# Patient Record
Sex: Male | Born: 1990 | Race: Black or African American | Hispanic: No | Marital: Single | Smoking: Current every day smoker
Health system: Southern US, Community
[De-identification: ages and names within clinical notes are randomized; demographics above are authoritative.]

---

## 2016-11-13 ENCOUNTER — Ambulatory Visit: Payer: Self-pay

## 2018-01-26 ENCOUNTER — Ambulatory Visit (INDEPENDENT_AMBULATORY_CARE_PROVIDER_SITE_OTHER): Payer: Self-pay

## 2018-01-26 ENCOUNTER — Encounter: Payer: Self-pay | Admitting: Family Medicine

## 2018-01-26 ENCOUNTER — Ambulatory Visit: Payer: Self-pay | Admitting: Family Medicine

## 2018-01-26 VITALS — BP 110/70 | HR 78 | Ht 67.0 in | Wt 129.1 lb

## 2018-01-26 DIAGNOSIS — J189 Pneumonia, unspecified organism: Secondary | ICD-10-CM

## 2018-01-26 DIAGNOSIS — J181 Lobar pneumonia, unspecified organism: Secondary | ICD-10-CM

## 2018-01-26 LAB — CBC
HCT: 44.5 % (ref 39.0–52.0)
Hemoglobin: 14.9 g/dL (ref 13.0–17.0)
MCHC: 33.5 g/dL (ref 30.0–36.0)
MCV: 95.6 fl (ref 78.0–100.0)
Platelets: 420 10*3/uL — ABNORMAL HIGH (ref 150.0–400.0)
RBC: 4.65 Mil/uL (ref 4.22–5.81)
RDW: 12.6 % (ref 11.5–15.5)
WBC: 8.6 10*3/uL (ref 4.0–10.5)

## 2018-01-26 MED ORDER — HYDROCODONE-HOMATROPINE 5-1.5 MG/5ML PO SYRP: 5.0000 mL | ORAL_SOLUTION | Freq: Four times a day (QID) | ORAL | 0 refills | Status: AC | PRN

## 2018-01-26 MED ORDER — AZITHROMYCIN 250 MG PO TABS: ORAL_TABLET | ORAL | 0 refills | Status: AC

## 2018-01-26 NOTE — Patient Instructions (Addendum)

## 2018-01-26 NOTE — Progress Notes (Signed)
Subjective:  Patient ID: George Carter, male    DOB: March 05, 1991  Age: 27 y.o. MRN: [id number]  CC: Establish Care and Cough   HPI [name] presents for evaluation of 5 days history of URI symptoms.  He has been left with a cough that is been productive of purulent phlegm.  He feels a soreness in his right upper chest area.  He feels tight in the chest with a question of wheezing.  He has no history of asthma and does not smoke.  He has no facial pressure or teeth pain.  Has ongoing issues with postnasal drip he says.  History George Carter has no past medical history on file.   He has no past surgical history on file.   His family history is not on file.He reports that he has quit smoking. he has never used smokeless tobacco. His alcohol and drug histories are not on file.  No outpatient medications prior to visit.   No facility-administered medications prior to visit.     ROS Review of Systems  Constitutional: Positive for fatigue. Negative for chills and fever.  HENT: Positive for postnasal drip. Negative for congestion, facial swelling, rhinorrhea, sinus pressure, sinus pain, sore throat and trouble swallowing.   Eyes: Negative for photophobia.  Respiratory: Positive for cough and chest tightness. Negative for shortness of breath and wheezing.   Cardiovascular: Negative.   Gastrointestinal: Negative.   Musculoskeletal: Negative for arthralgias and myalgias.  Skin: Negative for pallor and rash.  Neurological: Negative for weakness and headaches.  Hematological: Does not bruise/bleed easily.  Psychiatric/Behavioral: Negative.     Objective:  BP 110/70 (BP Location: Left Arm, Patient Position: Sitting, Cuff Size: Normal)   Pulse 78   Ht 5\' 7"  (1.702 m)   Wt 129 lb 2 oz (58.6 kg)   SpO2 96%   BMI 20.22 kg/m   Physical Exam  Constitutional: He is oriented to person, place, and time. He appears well-developed and well-nourished. No distress.  HENT:  Head: Normocephalic  and atraumatic.  Right Ear: External ear normal.  Left Ear: External ear normal.  Mouth/Throat: Oropharynx is clear and moist. No oropharyngeal exudate.  Eyes: Conjunctivae are normal. Pupils are equal, round, and reactive to light. Right eye exhibits no discharge. Left eye exhibits no discharge. No scleral icterus.  Neck: Neck supple. No JVD present. No tracheal deviation present. No thyromegaly present.  Cardiovascular: Normal rate, regular rhythm and normal heart sounds.  Pulmonary/Chest: Effort normal. No stridor. He has no decreased breath sounds. He has no wheezes. He has rhonchi in the right lower field and the left lower field. He has rales in the right lower field and the left lower field.  Abdominal: Bowel sounds are normal.  Lymphadenopathy:    He has no cervical adenopathy.  Neurological: He is oriented to person, place, and time.  Skin: Skin is warm and dry. He is not diaphoretic.  Psychiatric: He has a normal mood and affect. His behavior is normal.      Assessment & Plan:   George Carter was seen today for establish care and cough.  Diagnoses and all orders for this visit:  Pneumonia of both lower lobes due to infectious organism (HCC) -     CBC -     DG Chest 2 View; Future -     DG Chest 2 View -     azithromycin (ZITHROMAX) 250 MG tablet; Take 2 pills today and then one each day until finished. -  HYDROcodone-homatropine (HYCODAN) 5-1.5 MG/5ML syrup; Take 5 mLs by mouth every 6 (six) hours as needed for cough.   I am having George Carter start on azithromycin and HYDROcodone-homatropine.  Meds ordered this encounter  Medications  . azithromycin (ZITHROMAX) 250 MG tablet    Sig: Take 2 pills today and then one each day until finished.    Dispense:  6 tablet    Refill:  0  . HYDROcodone-homatropine (HYCODAN) 5-1.5 MG/5ML syrup    Sig: Take 5 mLs by mouth every 6 (six) hours as needed for cough.    Dispense:  120 mL    Refill:  0    Patient will rest for a  few days.  Take medicines as directed.  No obvious pneumonia on the chest x-ray.  CBC results are pending.  Advised him to follow-up within a month for a physical exam. Follow-up: Return in about 3 days (around 01/29/2018), or if symptoms worsen or fail to improve.  George SaxWilliam Alfred Kremer, MD

## 2018-01-29 ENCOUNTER — Ambulatory Visit: Payer: Self-pay | Admitting: Family Medicine

## 2018-01-29 ENCOUNTER — Encounter: Payer: Self-pay | Admitting: Family Medicine

## 2018-01-29 VITALS — BP 100/60 | HR 60 | Temp 98.7°F | Wt 126.0 lb

## 2018-01-29 DIAGNOSIS — J189 Pneumonia, unspecified organism: Secondary | ICD-10-CM

## 2018-01-29 DIAGNOSIS — R0781 Pleurodynia: Secondary | ICD-10-CM | POA: Insufficient documentation

## 2018-01-29 DIAGNOSIS — J181 Lobar pneumonia, unspecified organism: Secondary | ICD-10-CM

## 2018-01-29 LAB — CBC
HEMATOCRIT: 43.3 % (ref 39.0–52.0)
Hemoglobin: 14.8 g/dL (ref 13.0–17.0)
MCHC: 34.2 g/dL (ref 30.0–36.0)
MCV: 95.2 fl (ref 78.0–100.0)
Platelets: 448 10*3/uL — ABNORMAL HIGH (ref 150.0–400.0)
RBC: 4.55 Mil/uL (ref 4.22–5.81)
RDW: 12.8 % (ref 11.5–15.5)
WBC: 4.6 10*3/uL (ref 4.0–10.5)

## 2018-01-29 MED ORDER — IBUPROFEN 600 MG PO TABS: 600.0000 mg | ORAL_TABLET | Freq: Three times a day (TID) | ORAL | 0 refills | Status: AC | PRN

## 2018-01-29 NOTE — Patient Instructions (Addendum)
Chest Wall Pain Chest wall pain is pain in or around the bones and muscles of your chest. Sometimes, an injury causes this pain. Sometimes, the cause may not be known. This pain may take several weeks or longer to get better. Follow these instructions at home: Pay attention to any changes in your symptoms. Take these actions to help with your pain:  Rest as told by your health care provider.  Avoid activities that cause pain. These include any activities that use your chest muscles or your abdominal and side muscles to lift heavy items.  If directed, apply ice to the painful area: ? Put ice in a plastic bag. ? Place a towel between your skin and the bag. ? Leave the ice on for 20 minutes, 2-3 times per day.  Take over-the-counter and prescription medicines only as told by your health care provider.  Do not use tobacco products, including cigarettes, chewing tobacco, and e-cigarettes. If you need help quitting, ask your health care provider.  Keep all follow-up visits as told by your health care provider. This is important.  Contact a health care provider if:  You have a fever.  Your chest pain becomes worse.  You have new symptoms. Get help right away if:  You have nausea or vomiting.  You feel sweaty or light-headed.  You have a cough with phlegm (sputum) or you cough up blood.  You develop shortness of breath. This information is not intended to replace advice given to you by your health care provider. Make sure you discuss any questions you have with your health care provider. Document Released: 11/10/2005 Document Revised: 03/20/2016 Document Reviewed: 02/05/2015 Elsevier Interactive Patient Education  2018 [employer]  Pleurodynia Pleurodynia is sudden, severe pain in the chest and abdomen that is caused by complications from a viral infection. Pleurodynia may also be called epidemic pleurodynia or [place] disease. In most cases, pleurodynia goes away on its own in  2-5 days. What are the causes? This condition is caused by a virus called coxsackievirus B. In rare cases, other viruses may cause pleurodynia. These include coxsackievirus A or echoviruses. Coxsackievirus B can be spread from person to person (it is contagious). It can be spread through:  Coughing.  Sneezing.  Close physical contact with an infected person.  Contact with the stool of an infected person.  What increases the risk? This condition is more likely to develop in:  Children.  Places where there are poor public health conditions (sanitation).  Places where there is overcrowding.  What are the signs or symptoms? The main symptom of this condition is severe chest pain that makes breathing painful. Pain is usually on one side of the body, along the lower ribs. Pain starts suddenly and may last from a few seconds to 1 minute. You might feel pain again a few minutes or hours later. These brief periods of pain usually come and go for 2-5 days. In some cases, pain may continue to come and go for as long as a month. Other symptoms of pleurodynia may include:  Abdominal pain.  Fever.  Headache.  Sore throat.  Feeling unusually tired (fatigue).  Pain when pressing on the chest or belly.  Dry cough.  Nausea.  Vomiting.  Diarrhea.  Rash.  Testicle pain (men).  How is this diagnosed? This condition is diagnosed based on your medical history and a physical exam. You may have tests, including:  A throat swab.  Stool tests. You may need to give a stool  sample to your health care provider.  Blood tests.  Chest X-rays.  How is this treated? There is no specific treatment for this condition. However, symptoms go away within days or weeks. In the meantime, your health care provider may recommend NSAIDs to help relieve pain and fever. Follow these instructions at home: Medicines  Take over-the-counter and prescription medicines only as told by your health care  provider.  If your child has pleurodynia, do not give your child aspirin because of the association with Reye syndrome. General instructions  Rest for as long as told by your health care provider.  Return to your normal activities as told by your health care provider. Ask your health care provider what activities are safe for you.  Do not use any tobacco products, such as cigarettes, chewing tobacco, and e-cigarettes. If you need help quitting, ask your health care provider.  Drink enough fluid to keep your urine clear or pale yellow.  Keep all follow-up visits as told by your health care provider. This is important. How is this prevented?  Wash your hands often to prevent the virus from spreading. If soap and water are not available, use hand sanitizer.  Make sure that other people in your household also wash their hands often. Contact a health care provider if:  Your symptoms last longer than 5 days.  You have pain that does not get better with medicine. Get help right away if:  You have severe chest pain that is getting worse.  You have difficulty breathing.  You have a sudden, severe headache and a stiff neck. This information is not intended to replace advice given to you by your health care provider. Make sure you discuss any questions you have with your health care provider. Document Released: 10/30/2011 Document Revised: 04/17/2016 Document Reviewed: 05/23/2015 Elsevier Interactive Patient Education  [employer]2018 [employer]

## 2018-01-29 NOTE — Progress Notes (Addendum)
Subjective:  Patient ID: George Carter, male    DOB: [birth year]  Age: 27 y.o. MRN: [id number]  CC: Acute Visit   HPI Randale Carvalho Takagi presents for follow-up of his pneumonia.  His cough is improving and there is been less phlegm and no fever.  He continues to experience right upper chest pain with a deep breath.  There is no shortness of breath, wheezing or hemoptysis.  No family history of DVT.  He is healthy as far as he knows.  He is taking his his medicines as directed.  Outpatient Medications Prior to Visit  Medication Sig Dispense Refill  . azithromycin (ZITHROMAX) 250 MG tablet Take 2 pills today and then one each day until finished. 6 tablet 0  . HYDROcodone-homatropine (HYCODAN) 5-1.5 MG/5ML syrup Take 5 mLs by mouth every 6 (six) hours as needed for cough. 120 mL 0   No facility-administered medications prior to visit.     ROS Review of Systems  Constitutional: Negative for chills, fatigue and fever.  HENT: Negative.   Eyes: Negative for photophobia and visual disturbance.  Respiratory: Positive for cough. Negative for chest tightness and shortness of breath.   Cardiovascular: Positive for chest pain. Negative for palpitations and leg swelling.  Gastrointestinal: Negative.   Musculoskeletal: Negative for arthralgias and myalgias.  Skin: Negative for color change and rash.  Hematological: Does not bruise/bleed easily.  Psychiatric/Behavioral: Negative.     Objective:  BP 100/60 (BP Location: Left Arm, Patient Position: Sitting, Cuff Size: Normal)   Pulse 60   Temp 98.7 F (37.1 C) (Oral)   Wt 126 lb (57.2 kg)   SpO2 95%   BMI 19.73 kg/m   BP Readings from Last 3 Encounters:  01/29/18 100/60  01/26/18 110/70    Wt Readings from Last 3 Encounters:  01/29/18 126 lb (57.2 kg)  01/26/18 129 lb 2 oz (58.6 kg)    Physical Exam  Constitutional: He is oriented to person, place, and time. He appears well-developed and well-nourished. No distress.  HENT:  Head:  Normocephalic and atraumatic.  Right Ear: External ear normal.  Left Ear: External ear normal.  Eyes: Right eye exhibits no discharge. Left eye exhibits no discharge. No scleral icterus.  Neck: Neck supple. No JVD present. No tracheal deviation present. No thyromegaly present.  Cardiovascular: Normal rate, regular rhythm and normal heart sounds.  Pulmonary/Chest: Effort normal. No stridor. No respiratory distress. He has no wheezes. He has rales.  Abdominal: Bowel sounds are normal.  Musculoskeletal:       Right lower leg: He exhibits no tenderness, no swelling and no edema.       Left lower leg: He exhibits no tenderness, no swelling and no edema.  Lymphadenopathy:    He has no cervical adenopathy.  Neurological: He is alert and oriented to person, place, and time.  Skin: Skin is warm and dry. He is not diaphoretic.  Psychiatric: He has a normal mood and affect. His behavior is normal.    Lab Results  Component Value Date   WBC 8.6 01/26/2018   HGB 14.9 01/26/2018   HCT 44.5 01/26/2018   PLT 420.0 (H) 01/26/2018    Patient was never admitted.  Assessment & Plan:   Elvin was seen today for acute visit.  Diagnoses and all orders for this visit:  Pneumonia of both lower lobes due to infectious organism (HCC) -     CBC -     HIV antibody  Pleurodynia -  ibuprofen (ADVIL,MOTRIN) 600 MG tablet; Take 1 tablet (600 mg total) by mouth every 8 (eight) hours as needed.   I am having Kendric J. Manville start on ibuprofen. I am also having him maintain his azithromycin and HYDROcodone-homatropine.  Meds ordered this encounter  Medications  . ibuprofen (ADVIL,MOTRIN) 600 MG tablet    Sig: Take 1 tablet (600 mg total) by mouth every 8 (eight) hours as needed.    Dispense:  30 tablet    Refill:  0     Follow-up: No Follow-up on file.  Mliss SaxWilliam Alfred Lewi Drost, MD

## 2018-01-30 ENCOUNTER — Encounter (HOSPITAL_BASED_OUTPATIENT_CLINIC_OR_DEPARTMENT_OTHER): Payer: Self-pay | Admitting: Emergency Medicine

## 2018-01-30 ENCOUNTER — Emergency Department (HOSPITAL_BASED_OUTPATIENT_CLINIC_OR_DEPARTMENT_OTHER): Payer: Self-pay

## 2018-01-30 ENCOUNTER — Other Ambulatory Visit: Payer: Self-pay

## 2018-01-30 ENCOUNTER — Emergency Department (HOSPITAL_BASED_OUTPATIENT_CLINIC_OR_DEPARTMENT_OTHER)
Admission: EM | Admit: 2018-01-30 | Discharge: 2018-01-30 | Disposition: A | Discharge status: Home / Self Care | Payer: Self-pay | Attending: Emergency Medicine | Admitting: Emergency Medicine

## 2018-01-30 DIAGNOSIS — R05 Cough: Secondary | ICD-10-CM

## 2018-01-30 DIAGNOSIS — R059 Cough, unspecified: Secondary | ICD-10-CM

## 2018-01-30 DIAGNOSIS — F17200 Nicotine dependence, unspecified, uncomplicated: Secondary | ICD-10-CM | POA: Insufficient documentation

## 2018-01-30 DIAGNOSIS — Z79899 Other long term (current) drug therapy: Secondary | ICD-10-CM | POA: Insufficient documentation

## 2018-01-30 DIAGNOSIS — R079 Chest pain, unspecified: Secondary | ICD-10-CM | POA: Insufficient documentation

## 2018-01-30 DIAGNOSIS — J189 Pneumonia, unspecified organism: Secondary | ICD-10-CM | POA: Insufficient documentation

## 2018-01-30 LAB — CBC WITH DIFFERENTIAL/PLATELET
BASOS PCT: 1 %
Basophils Absolute: 0.1 10*3/uL (ref 0.0–0.1)
EOS PCT: 5 %
Eosinophils Absolute: 0.3 10*3/uL (ref 0.0–0.7)
HCT: 43.5 % (ref 39.0–52.0)
Hemoglobin: 14.5 g/dL (ref 13.0–17.0)
LYMPHS ABS: 2 10*3/uL (ref 0.7–4.0)
Lymphocytes Relative: 32 %
MCH: 31.5 pg (ref 26.0–34.0)
MCHC: 33.3 g/dL (ref 30.0–36.0)
MCV: 94.4 fL (ref 78.0–100.0)
Monocytes Absolute: 0.6 10*3/uL (ref 0.1–1.0)
Monocytes Relative: 9 %
Neutro Abs: 3.4 10*3/uL (ref 1.7–7.7)
Neutrophils Relative %: 53 %
PLATELETS: 389 10*3/uL (ref 150–400)
RBC: 4.61 MIL/uL (ref 4.22–5.81)
RDW: 12 % (ref 11.5–15.5)
WBC: 6.4 10*3/uL (ref 4.0–10.5)

## 2018-01-30 LAB — TROPONIN I: Troponin I: <0.03 ng/mL (ref <0.03)

## 2018-01-30 LAB — BASIC METABOLIC PANEL
Anion gap: 8 (ref 5–15)
BUN: 15 mg/dL (ref 6–20)
CALCIUM: 9.3 mg/dL (ref 8.9–10.3)
CHLORIDE: 104 mmol/L (ref 101–111)
CO2: 26 mmol/L (ref 22–32)
CREATININE: 0.86 mg/dL (ref 0.61–1.24)
GFR calc Af Amer: >60 mL/min (ref >60)
GFR calc non Af Amer: >60 mL/min (ref >60)
Glucose, Bld: 85 mg/dL (ref 65–99)
Potassium: 4.3 mmol/L (ref 3.5–5.1)
SODIUM: 138 mmol/L (ref 135–145)

## 2018-01-30 LAB — HIV ANTIBODY (ROUTINE TESTING W REFLEX): HIV: NONREACTIVE

## 2018-01-30 LAB — D-DIMER, QUANTITATIVE: D-Dimer, Quant: 0.64 ug/mL-FEU — ABNORMAL HIGH (ref 0.00–0.50)

## 2018-01-30 MED ORDER — IOPAMIDOL (ISOVUE-370) INJECTION 76%
100.0000 mL | Freq: Once | INTRAVENOUS | Status: AC | PRN
  Administered 2018-01-30: 100 mL via INTRAVENOUS

## 2018-01-30 NOTE — ED Notes (Addendum)
Pt taking antibiotics since Tuesday for pneumonia. States he is concerned bc he's not feeling any better. Reports pain in right chest with cough

## 2018-01-30 NOTE — Discharge Instructions (Signed)
You can take Tylenol or Ibuprofen as directed for pain. You can alternate Tylenol and Ibuprofen every 4 hours. If you take Tylenol at 1pm, then you can take Ibuprofen at 5pm. Then you can take Tylenol again at 9pm.   As we discussed, your discomfort today may be coming from inflammation from coughing any recent diagnosis of pneumonia.  Continue and finish her antibiotics as directed.  Her x-ray showed that her pneumonia is improving but she may have persistent cough for several weeks after.  Follow-up with your primary care doctor in the next 24-48 hours for further evaluation.  Return to the Emergency Department immediately if you experiencing worsening chest pain, difficulty breathing, nausea/vomiting, get very sweaty, headache or any other worsening or concerning symptoms.

## 2018-01-30 NOTE — ED Notes (Signed)
ED Provider at bedside. 

## 2018-01-30 NOTE — ED Triage Notes (Signed)
Pt dx with pneumonia this week and started on abx. Pt reports he is still having pain in his chest and having difficultly taking a deep breath.

## 2018-01-30 NOTE — ED Notes (Signed)
Patient transported to CT 

## 2018-01-30 NOTE — ED Notes (Signed)
Patient transported to X-ray 

## 2018-01-30 NOTE — ED Provider Notes (Signed)
[facility] EMERGENCY DEPARTMENT Provider Note   CSN: [id number] Arrival date & time: 01/30/18  1335     History   Chief Complaint Chief Complaint  Patient presents with  . Follow-up    pneumonia    HPI George Carter is a 27 y.o. male who presents for evaluation of cough and chest pain that has been ongoing for the last 4 days.  Patient was recently diagnosed with pneumonia on 01/26/18 by his primary care doctor.  Was started on azithromycin at that time which he states that he has been taking appropriately. He reports that on 01/27/18 he began having some chest pain that was worse with deep inspiration. He returned to primary care office yesterday for evaluation of persistent cough and chest discomfort.  Was prescribed ibuprofen at that time which he states he has been intermittently taking.  Mom brought patient into the emergency department today because she was concerned about persistent cough and chest pain.  Patient reports that he has chest pain with deep inspiration and with coughing.  Chest pain is right sided and he describes it as a discomfort. No chest pain at rest, with exertion.  No difficulty breathing.  He has not had any nausea or diaphoresis.  Patient states that the cough is nonproductive.  He has been eating and drinking appropriately.  Patient states he smokes a few black in miles a day.  He denies any cocaine, heroin, marijuana use.  He denies any personal cardiac history.  He denies any family cardiac history.  Patient denies any fevers, difficulty breathing, abdominal pain, nausea/vomiting, leg swelling. He denies any hormone/steroid use, recent immobilization, prior history of DVT/PE, recent surgery, leg swelling, or long travel.  The history is provided by the patient.    History reviewed. No pertinent past medical history.  Patient Active Problem List   Diagnosis Date Noted  . 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 Pneumonia of both lower lobes due to infectious  organism (HCC) 01/26/2018    History reviewed. No pertinent surgical history.     Home Medications    Prior to Admission medications   Medication Sig Start Date End Date Taking? Authorizing Provider  azithromycin (ZITHROMAX) 250 MG tablet Take 2 pills today and then one each day until finished. 01/26/18   Mliss Sax, MD  HYDROcodone-homatropine [facility]) 5-1.5 MG/5ML syrup Take 5 mLs by mouth every 6 (six) hours as needed for cough. 01/26/18   Mliss Sax, MD  ibuprofen (ADVIL,MOTRIN) 600 MG tablet Take 1 tablet (600 mg total) by mouth every 8 (eight) hours as needed. 01/29/18   Mliss Sax, MD    Family History No family history on file.  Social History Social History   Tobacco Use  . Smoking status: Current Every Day Smoker  . Smokeless tobacco: Never Used  Substance Use Topics  . Alcohol use: Yes  . Drug use: Not on file     Allergies   Patient has no known allergies.   Review of Systems Review of Systems  Constitutional: Negative for fever.  Respiratory: Positive for cough. Negative for shortness of breath.   Cardiovascular: Positive for chest pain.  Gastrointestinal: Negative for abdominal pain, nausea and vomiting.  Genitourinary: Negative for dysuria and hematuria.  Neurological: Negative for headaches.     Physical Exam Updated Vital Signs BP 108/70 (BP Location: Left Arm)   Pulse 67   Temp 99 F (37.2 C) (Oral)   Resp 18   Ht 5\' 7"  (1.702 m)  Wt 57.2 kg (126 lb)   SpO2 100%   BMI 19.73 kg/m   Physical Exam  Constitutional: He is oriented to person, place, and time. He appears well-developed and well-nourished.  HENT:  Head: Normocephalic and atraumatic.  Mouth/Throat: Oropharynx is clear and moist and mucous membranes are normal.  Eyes: Conjunctivae, EOM and lids are normal. Pupils are equal, round, and reactive to light.  Neck: Full passive range of motion without pain.  Cardiovascular: Normal rate, regular  rhythm, normal heart sounds and normal pulses. Exam reveals no gallop and no friction rub.  No murmur heard. Pulses:      Radial pulses are 2+ on the right side, and 2+ on the left side.  Pulmonary/Chest: Effort normal and breath sounds normal.  No evidence of respiratory distress. Able to speak in full sentences without difficulty.  Tenderness palpation to anterior chest wall.  No deformity or crepitus noted.  Abdominal: Soft. Normal appearance. There is no tenderness. There is no rigidity and no guarding.  Musculoskeletal: Normal range of motion.  BLE are symmetric in appearance. No edema noted.   Neurological: He is alert and oriented to person, place, and time.  Skin: Skin is warm and dry. Capillary refill takes less than 2 seconds.  Psychiatric: He has a normal mood and affect. His speech is normal.  Nursing note and vitals reviewed.    ED Treatments / Results  Labs (all labs ordered are listed, but only abnormal results are displayed) Labs Reviewed  D-DIMER, QUANTITATIVE (NOT AT [facility]) - Abnormal; Notable for the following components:      Result Value   D-Dimer, Quant 0.64 (*)    All other components within normal limits  BASIC METABOLIC PANEL  CBC WITH DIFFERENTIAL/PLATELET  TROPONIN I    EKG  EKG Interpretation  Date/Time:  Saturday January 30 2018 14:47:08 EST Ventricular Rate:  59 PR Interval:    QRS Duration: 102 QT Interval:  410 QTC Calculation: 407 R Axis:   73 Text Interpretation:  Normal sinus rhythm diffuse ST elevation most consistent with early repolarization No old tracing to compare Confirmed by Pricilla Loveless [phone]) on 01/30/2018 2:57:05 PM       Radiology Dg Chest 2 View  Result Date: 01/30/2018 CLINICAL DATA:  Diagnosed with pneumonia 4 days ago. Chest pain. On antibiotics. EXAM: CHEST - 2 VIEW COMPARISON:  01/26/2018 FINDINGS: Midline trachea. Normal heart size and mediastinal contours. No pleural effusion or pneumothorax. Mild biapical pleural  thickening. Improved right middle lobe and left lower lobe airspace disease. Resolved interstitial thickening. IMPRESSION: Improved, nearly resolved left lower and right middle lobe airspace disease/pneumonia. Resolved interstitial thickening which likely represented a viral or atypical bacterial component. No new abnormality. Electronically Signed   By: Jeronimo Greaves M.D.   On: 01/30/2018 14:44   Ct Angio Chest Pe W And/or Wo Contrast  Result Date: 01/30/2018 CLINICAL DATA:  History of pneumonia with difficulty breathing, initial encounter EXAM: CT ANGIOGRAPHY CHEST WITH CONTRAST TECHNIQUE: Multidetector CT imaging of the chest was performed using the standard protocol during bolus administration of intravenous contrast. Multiplanar CT image reconstructions and MIPs were obtained to evaluate the vascular anatomy. CONTRAST:  75 mL ISOVUE-370 COMPARISON:  Plain film from earlier in the same day. FINDINGS: Cardiovascular: Thoracic aorta is incompletely opacified. No definitive aneurysmal dilatation is seen. The pulmonary artery shows a normal branching pattern without intraluminal filling defect to suggest pulmonary embolism. No coronary calcifications are seen. No cardiac enlargement is noted. Mediastinum/Nodes: Thoracic inlet is  within normal limits. No hilar or mediastinal adenopathy is identified. The esophagus is within normal limits. Lungs/Pleura: The lungs are well aerated bilaterally. Patchy left lower, right middle and right lower lobe infiltrates are seen. These correspond with that seen on prior plain film examination. No sizable effusion is noted. No sizable parenchymal nodule is noted. Upper Abdomen: Visualized upper abdomen is within normal limits. Musculoskeletal: No acute bony abnormality is noted. Review of the MIP images confirms the above findings. IMPRESSION: Patchy left lower, right middle and right lower lobe infiltrates similar to that seen on recent chest x-ray. No evidence of pulmonary  emboli. No other focal abnormality is seen. Electronically Signed   By: Alcide Clever M.D.   On: 01/30/2018 16:08    Procedures Procedures (including critical care time)  Medications Ordered in ED Medications  iopamidol (ISOVUE-370) 76 % injection 100 mL (100 mLs Intravenous Contrast Given 01/30/18 1548)     Initial Impression / Assessment and Plan / ED Course  I have reviewed the triage vital signs and the nursing notes.  Pertinent labs & imaging results that were available during my care of the patient were reviewed by me and considered in my medical decision making (see chart for details).     27 year old male who presents for evaluation of chest pain and cough that is been ongoing for last 4 days.  Was diagnosed with pneumonia on 01/26/18.  Was given azithromycin which he has been taking.  Reports that he started having some right-sided chest pain that was worse with deep inspiration the next day.  Was seen by his primary care doctor yesterday for evaluation of chest pain.  Was started on ibuprofen which she has been intermittently taking.  Chest pain is not worse with exertion and does not have any associated difficulty breathing, nausea/vomiting, diaphoresis.  Patient reports that he is a current smoker but denies any history of hypertension, diabetes.  He denies any personal cardiac history.  Mom and patient deny any family cardiac history or early deaths from heart attacks. Patient is afebrile, non-toxic appearing, sitting comfortably on examination table. Vital signs reviewed and stable.  I suspect that symptoms are likely secondary to patient's recent diagnosis of pneumonia with perhaps some costochondritis.  Low suspicion for PE or ACS etiology.  Particularly low suspicion for ACS etiology given patient's history/physical exam, presentation, lack of risk factors.  Will plan for basic labs, EKG, chest x-ray.  Labs reviewed.  CBC is without any significant leukocytosis, anemia.  Troponin is  negative.  BMP is unremarkable.  D-dimer is slightly positive.  EKG as documented above.  Chest x-ray shows improving right lower lobe pneumonia.  Given positive d-dimer, will plan for CTA of chest for evaluation of possible blood clot.  Given that patient's pain has been ongoing for the last 4 days, lack of risk factors and has a negative troponin, no indication for delta troponin at this time.  Discussed patient with Dr. Deretha Emory who agress with plan.   Discussed results with patient and mom. Suspect symptoms are secondary to continued pneumonia and possible costochrondritis. Instructed him to finish his abx and follow-up with PCP in 24-48 hours. Patient had ample opportunity for questions and discussion. All patient's questions were answered with full understanding. Strict return precautions discussed. Patient expresses understanding and agreement to plan.   Final Clinical Impressions(s) / ED Diagnoses   Final diagnoses:  Chest pain, unspecified type  Cough    ED Discharge Orders    None  Maxwell CaulLayden, Lindsey A, PA-C 01/31/18 [id number]    Vanetta MuldersZackowski, Scott, MD 01/31/18 [phone]

## 2019-03-15 ENCOUNTER — Telehealth: Payer: Self-pay | Admitting: Family Medicine

## 2019-03-15 NOTE — Telephone Encounter (Signed)
Called and left vm for patient. Calling to schedule virtual visit with Dr. Kremer.  °

## 2019-03-22 ENCOUNTER — Telehealth: Payer: Self-pay | Admitting: Family Medicine

## 2019-03-22 NOTE — Telephone Encounter (Signed)
Called patient to make aware we are offering virtual visits if there is any reason he needs to schedule an appointment except for physicals which are being scheduled out in July

## 2019-07-06 IMAGING — DX DG CHEST 2V
2 series · 2 of 2 positions shown · non-contrast
Comparison: None.

CLINICAL DATA: Productive cough and RIGHT chest pain for 1 week.
Former smoker.

EXAM:
CHEST  2 VIEW

[chest pa]
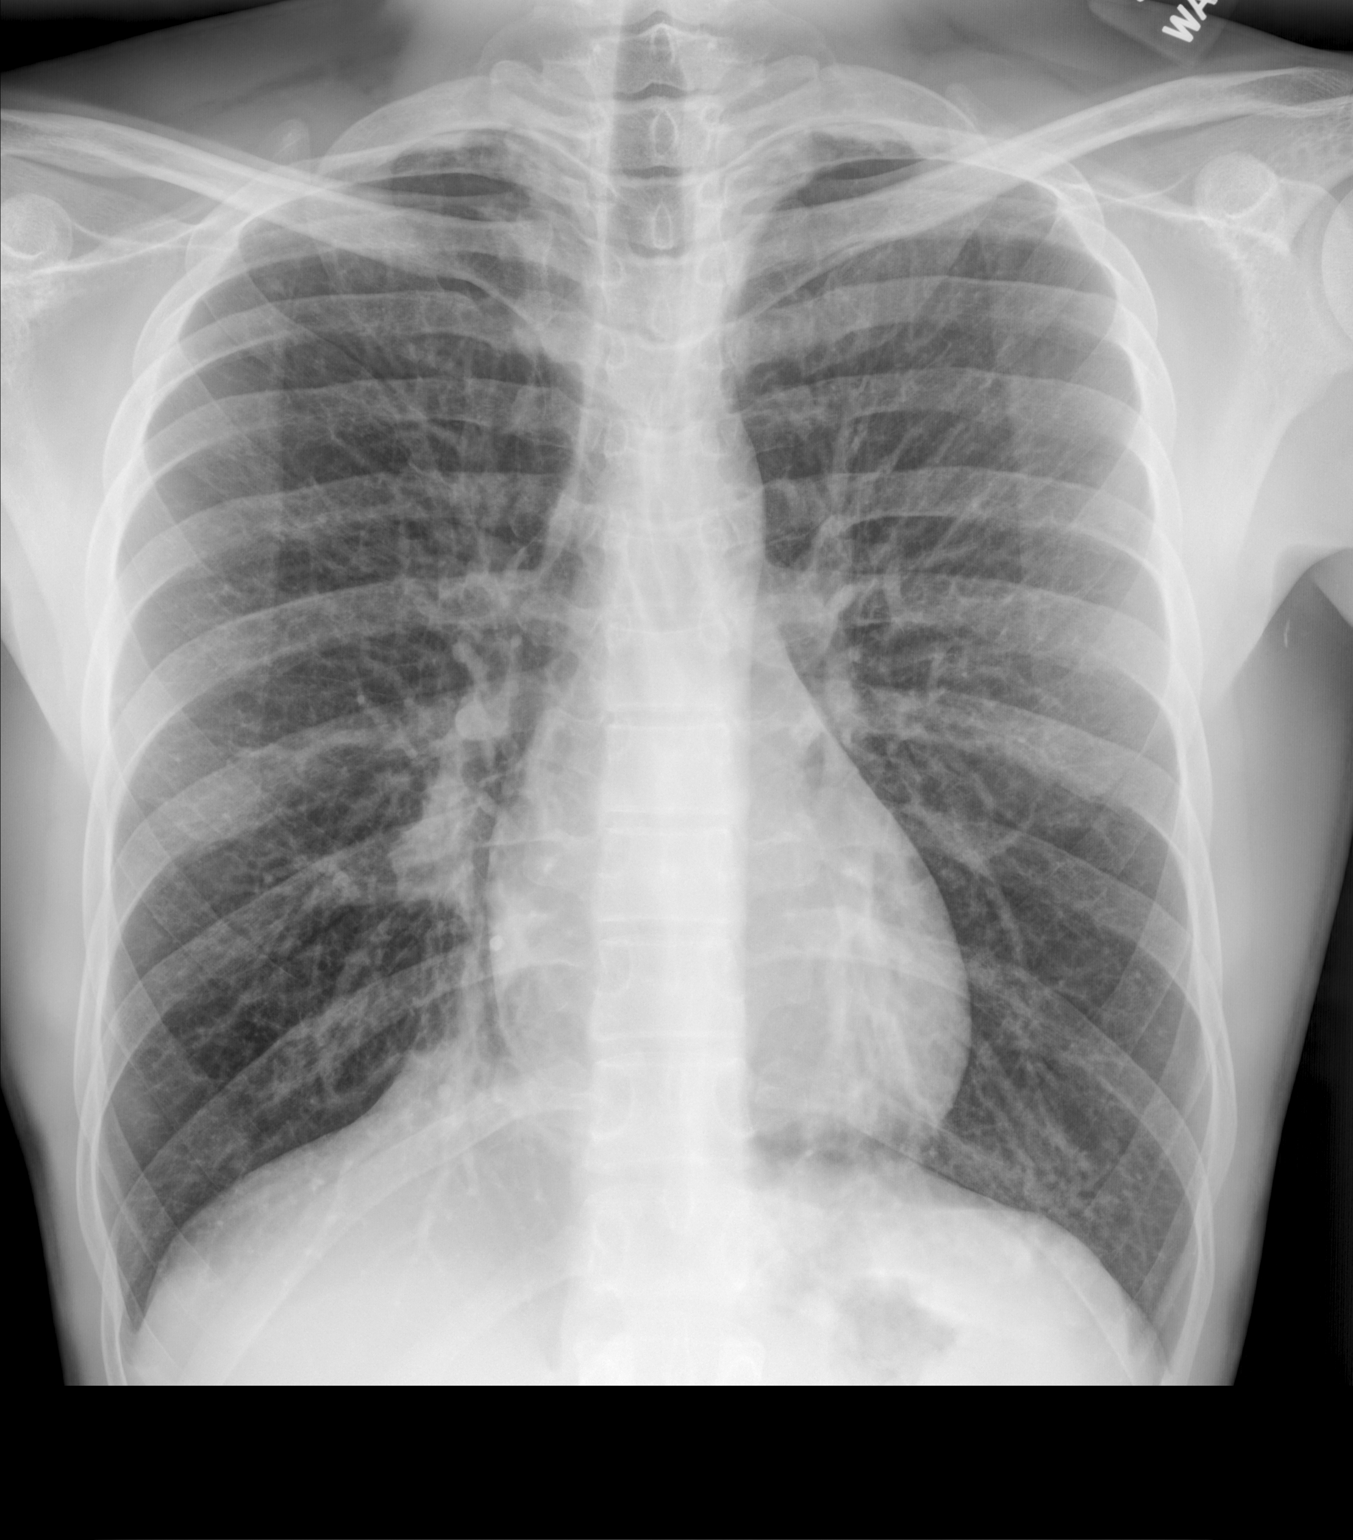

[chest lat]
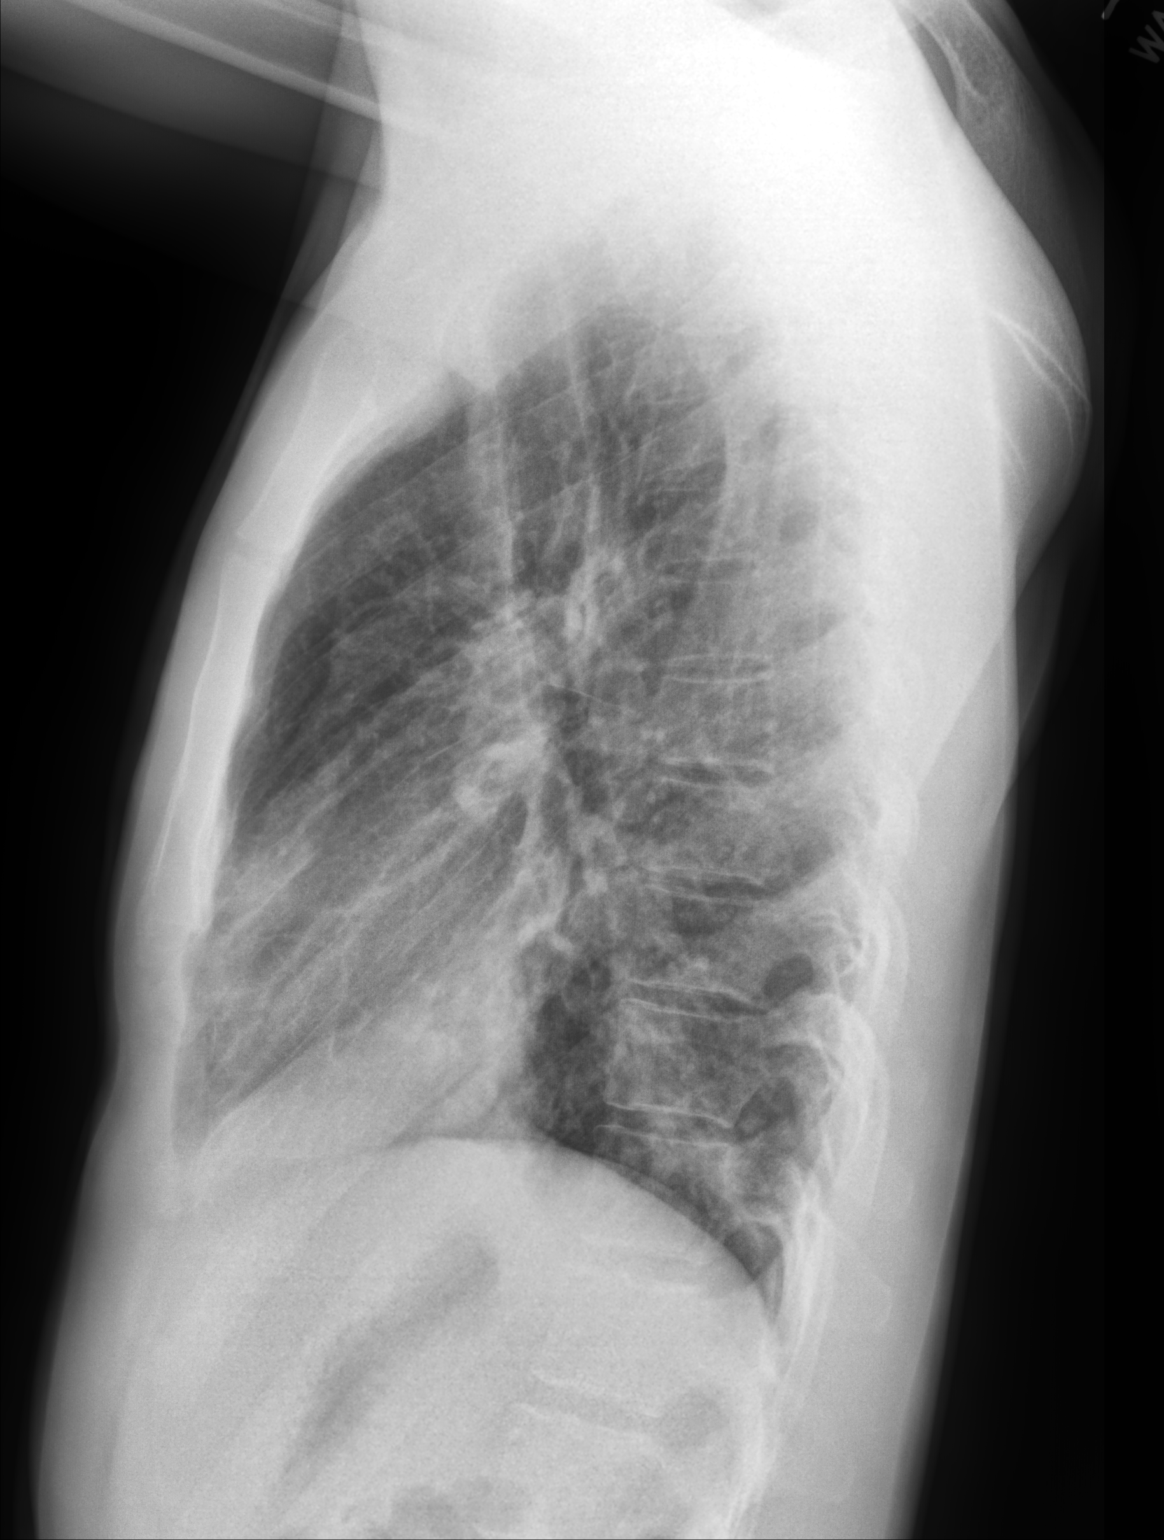

[2 of 2 positions shown; findings below may reference images not displayed]

FINDINGS: Diffuse interstitial prominence with bibasilar bandlike densities.
No pleural effusion. No pneumothorax. Cardiomediastinal silhouette
is normal. Apical pleural thickening. Soft tissue planes and
included osseous structures are non suspicious.
IMPRESSION: Interstitial prominence seen with atypical infection. Bibasilar
atelectasis versus pneumonia.

These results will be called to the ordering clinician or
representative by the Radiologist Assistant, and communication
documented in the PACS or zVision Dashboard.
